# Patient Record
Sex: Male | Born: 1975 | Hispanic: Yes | Marital: Married | Smoking: Never smoker
Health system: Southern US, Community
[De-identification: ages and names within clinical notes are randomized; demographics above are authoritative.]

## PROBLEM LIST (undated history)

## (undated) DIAGNOSIS — G5621 Lesion of ulnar nerve, right upper limb: Secondary | ICD-10-CM

## (undated) HISTORY — PX: WRIST SURGERY: SHX841

---

## 2013-09-01 DIAGNOSIS — S52209A Unspecified fracture of shaft of unspecified ulna, initial encounter for closed fracture: Secondary | ICD-10-CM

## 2013-09-01 DIAGNOSIS — S63599A Other specified sprain of unspecified wrist, initial encounter: Secondary | ICD-10-CM

## 2013-09-01 HISTORY — DX: Unspecified fracture of shaft of unspecified ulna, initial encounter for closed fracture: S52.209A

## 2013-09-01 HISTORY — DX: Other specified sprain of unspecified wrist, initial encounter: S63.599A

## 2014-03-05 DIAGNOSIS — S63599A Other specified sprain of unspecified wrist, initial encounter: Secondary | ICD-10-CM | POA: Insufficient documentation

## 2015-05-01 DIAGNOSIS — M25839 Other specified joint disorders, unspecified wrist: Secondary | ICD-10-CM | POA: Insufficient documentation

## 2015-05-01 HISTORY — PX: WRIST ARTHROTOMY: SUR112

## 2015-05-01 HISTORY — PX: WRIST ARTHROSCOPY: SUR100

## 2018-04-06 ENCOUNTER — Other Ambulatory Visit: Payer: Self-pay

## 2018-04-06 ENCOUNTER — Emergency Department (HOSPITAL_BASED_OUTPATIENT_CLINIC_OR_DEPARTMENT_OTHER): Payer: Managed Care, Other (non HMO)

## 2018-04-06 ENCOUNTER — Emergency Department (HOSPITAL_BASED_OUTPATIENT_CLINIC_OR_DEPARTMENT_OTHER)
Admission: EM | Admit: 2018-04-06 | Discharge: 2018-04-07 | Disposition: A | Discharge status: Home / Self Care | Payer: Managed Care, Other (non HMO) | Attending: Emergency Medicine | Admitting: Emergency Medicine

## 2018-04-06 ENCOUNTER — Encounter (HOSPITAL_BASED_OUTPATIENT_CLINIC_OR_DEPARTMENT_OTHER): Payer: Self-pay | Admitting: Emergency Medicine

## 2018-04-06 DIAGNOSIS — M545 Low back pain: Secondary | ICD-10-CM | POA: Insufficient documentation

## 2018-04-06 MED ORDER — HYDROCODONE-ACETAMINOPHEN 5-325 MG PO TABS
2.0000 | ORAL_TABLET | Freq: Once | ORAL | Status: AC
  Administered 2018-04-06: 2 via ORAL
  Filled 2018-04-06: qty 2

## 2018-04-06 MED ORDER — ONDANSETRON 4 MG PO TBDP
4.0000 mg | ORAL_TABLET | Freq: Once | ORAL | Status: AC
  Administered 2018-04-06: 4 mg via ORAL
  Filled 2018-04-06: qty 1
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
## 2018-04-07 MED ORDER — METHOCARBAMOL 500 MG PO TABS: 500.0000 mg | ORAL_TABLET | Freq: Three times a day (TID) | ORAL | 0 refills | Status: AC | PRN

## 2018-04-07 MED ORDER — NAPROXEN 500 MG PO TABS: 500.0000 mg | ORAL_TABLET | Freq: Two times a day (BID) | ORAL | 0 refills | Status: AC

## 2018-04-07 NOTE — ED Notes (Signed)
PT states understanding of care given, follow up care, and medication prescribed. PT ambulated from ED to car with a steady gait. 

## 2020-06-23 ENCOUNTER — Telehealth: Payer: Self-pay

## 2020-06-23 NOTE — Telephone Encounter (Signed)
Patient would like to establish care with you 

## 2020-06-24 NOTE — Telephone Encounter (Signed)
It is okay, thank you.

## 2020-06-25 ENCOUNTER — Emergency Department (HOSPITAL_BASED_OUTPATIENT_CLINIC_OR_DEPARTMENT_OTHER): Payer: 59

## 2020-06-25 ENCOUNTER — Encounter (HOSPITAL_BASED_OUTPATIENT_CLINIC_OR_DEPARTMENT_OTHER): Payer: Self-pay

## 2020-06-25 ENCOUNTER — Emergency Department (HOSPITAL_BASED_OUTPATIENT_CLINIC_OR_DEPARTMENT_OTHER)
Admission: EM | Admit: 2020-06-25 | Discharge: 2020-06-25 | Disposition: A | Discharge status: Home / Self Care | Payer: 59 | Attending: Emergency Medicine | Admitting: Emergency Medicine

## 2020-06-25 ENCOUNTER — Other Ambulatory Visit: Payer: Self-pay

## 2020-06-25 DIAGNOSIS — R0789 Other chest pain: Secondary | ICD-10-CM | POA: Insufficient documentation

## 2020-06-25 DIAGNOSIS — R1011 Right upper quadrant pain: Secondary | ICD-10-CM | POA: Insufficient documentation

## 2020-06-25 DIAGNOSIS — S52611A Displaced fracture of right ulna styloid process, initial encounter for closed fracture: Secondary | ICD-10-CM | POA: Insufficient documentation

## 2020-06-25 DIAGNOSIS — R42 Dizziness and giddiness: Secondary | ICD-10-CM | POA: Diagnosis not present

## 2020-06-25 DIAGNOSIS — R1013 Epigastric pain: Secondary | ICD-10-CM | POA: Insufficient documentation

## 2020-06-25 DIAGNOSIS — G5621 Lesion of ulnar nerve, right upper limb: Secondary | ICD-10-CM | POA: Insufficient documentation

## 2020-06-25 HISTORY — DX: Lesion of ulnar nerve, right upper limb: G56.21

## 2020-06-25 LAB — CBC WITH DIFFERENTIAL/PLATELET
Abs Immature Granulocytes: 0.03 10*3/uL (ref 0.00–0.07)
Basophils Absolute: 0 10*3/uL (ref 0.0–0.1)
Basophils Relative: 0 %
Eosinophils Absolute: 0.1 10*3/uL (ref 0.0–0.5)
Eosinophils Relative: 2 %
HCT: 45.4 % (ref 39.0–52.0)
Hemoglobin: 15.3 g/dL (ref 13.0–17.0)
Immature Granulocytes: 0 %
Lymphocytes Relative: 32 %
Lymphs Abs: 2.3 10*3/uL (ref 0.7–4.0)
MCH: 30.2 pg (ref 26.0–34.0)
MCHC: 33.7 g/dL (ref 30.0–36.0)
MCV: 89.7 fL (ref 80.0–100.0)
Monocytes Absolute: 0.6 10*3/uL (ref 0.1–1.0)
Monocytes Relative: 8 %
Neutro Abs: 4.3 10*3/uL (ref 1.7–7.7)
Neutrophils Relative %: 58 %
Platelets: 274 10*3/uL (ref 150–400)
RBC: 5.06 MIL/uL (ref 4.22–5.81)
RDW: 12.9 % (ref 11.5–15.5)
WBC: 7.3 10*3/uL (ref 4.0–10.5)
nRBC: 0 % (ref 0.0–0.2)

## 2020-06-25 LAB — COMPREHENSIVE METABOLIC PANEL
ALT: 33 U/L (ref 0–44)
AST: 20 U/L (ref 15–41)
Albumin: 4.2 g/dL (ref 3.5–5.0)
Alkaline Phosphatase: 72 U/L (ref 38–126)
Anion gap: 8 (ref 5–15)
BUN: 17 mg/dL (ref 6–20)
CO2: 29 mmol/L (ref 22–32)
Calcium: 9.2 mg/dL (ref 8.9–10.3)
Chloride: 101 mmol/L (ref 98–111)
Creatinine, Ser: 0.87 mg/dL (ref 0.61–1.24)
GFR, Estimated: >60 mL/min (ref >60)
Glucose, Bld: 101 mg/dL — ABNORMAL HIGH (ref 70–99)
Potassium: 4.2 mmol/L (ref 3.5–5.1)
Sodium: 138 mmol/L (ref 135–145)
Total Bilirubin: 1.5 mg/dL — ABNORMAL HIGH (ref 0.3–1.2)
Total Protein: 7.5 g/dL (ref 6.5–8.1)

## 2020-06-25 LAB — OCCULT BLOOD X 1 CARD TO LAB, STOOL: Fecal Occult Bld: NEGATIVE

## 2020-06-25 LAB — LIPASE, BLOOD: Lipase: 26 U/L (ref 11–51)

## 2020-06-25 MED ORDER — OMEPRAZOLE 20 MG PO CPDR: 20.0000 mg | DELAYED_RELEASE_CAPSULE | Freq: Every day | ORAL | 0 refills | Status: AC

## 2020-06-25 NOTE — ED Notes (Signed)
Patient transported to US 

## 2020-06-25 NOTE — ED Provider Notes (Signed)
[facility] EMERGENCY DEPARTMENT Provider Note   CSN: [id number] Arrival date & time: 06/25/20  9024     History Chief Complaint  Patient presents with  . Chest Pain    Dale King is a 45 y.o. male.  HPI Patient presents with epigastric pain.  Goes to the back.  Some lightheadedness.  Is felt sweaty.  Feels a little dizzy when he stands up.  Also pain with pressure on the anterior chest.  Not on blood thinners.  States for the last few days also has had black tarry stool.  No history of GI bleed.  Not reportedly on NSAIDs.  No vomiting.    History reviewed. No pertinent past medical history.  There are no problems to display for this patient.   Past Surgical History:  Procedure Laterality Date  . WRIST SURGERY         No family history on file.  Social History   Tobacco Use  . Smoking status: Never Smoker  . Smokeless tobacco: Never Used  Substance Use Topics  . Alcohol use: Never  . Drug use: Never    Home Medications Prior to Admission medications   Medication Sig Start Date End Date Taking? Authorizing Provider  omeprazole (PRILOSEC) 20 MG capsule Take 1 capsule (20 mg total) by mouth daily. 06/25/20  Yes Benjiman Core, MD  methocarbamol (ROBAXIN) 500 MG tablet Take 1 tablet (500 mg total) by mouth every 8 (eight) hours as needed for muscle spasms. 04/07/18   Ward, Layla Maw, DO  naproxen (NAPROSYN) 500 MG tablet Take 1 tablet (500 mg total) by mouth 2 (two) times daily. 04/07/18   Ward, Layla Maw, DO    Allergies    Patient has no known allergies.  Review of Systems   Review of Systems  Constitutional: Negative for appetite change and diaphoresis.  HENT: Negative for congestion.   Respiratory: Negative for shortness of breath.   Cardiovascular: Positive for chest pain.  Gastrointestinal: Positive for abdominal pain.  Genitourinary: Negative for flank pain.  Musculoskeletal: Positive for back pain.  Skin: Negative for rash.   Neurological: Positive for light-headedness.  Psychiatric/Behavioral: Negative for confusion.    Physical Exam Updated Vital Signs BP 115/74 (BP Location: Right Arm)   Pulse 64   Temp 98.7 F (37.1 C) (Oral)   Resp 16   Ht 5\' 6"  (1.676 m)   Wt 92.1 kg   SpO2 100%   BMI 32.77 kg/m   Physical Exam Vitals reviewed.  HENT:     Head: Normocephalic.  Cardiovascular:     Rate and Rhythm: Normal rate and regular rhythm.  Pulmonary:     Breath sounds: No wheezing or rhonchi.  Chest:     Chest wall: Tenderness present.     Comments: Tenderness shows lower anterior mid chest wall. Abdominal:     Tenderness: There is abdominal tenderness.     Comments: Epigastric tenderness without rebound or guarding.  No hernia palpated  Musculoskeletal:     Cervical back: Neck supple.     Right lower leg: No edema.     Left lower leg: No edema.  Skin:    General: Skin is warm.     Capillary Refill: Capillary refill takes less than 2 seconds.  Neurological:     Mental Status: He is alert and oriented to person, place, and time.     ED Results / Procedures / Treatments   Labs (all labs ordered are listed, but only abnormal results are  displayed) Labs Reviewed  COMPREHENSIVE METABOLIC PANEL - Abnormal; Notable for the following components:      Result Value   Glucose, Bld 101 (*)    Total Bilirubin 1.5 (*)    All other components within normal limits  CBC WITH DIFFERENTIAL/PLATELET  LIPASE, BLOOD  OCCULT BLOOD X 1 CARD TO LAB, STOOL    EKG None  Radiology DG Chest Portable 1 View  Result Date: 06/25/2020 CLINICAL DATA:  Chest pain. EXAM: PORTABLE CHEST 1 VIEW COMPARISON:  None. FINDINGS: The heart size and mediastinal contours are within normal limits. There is no evidence of pulmonary edema, consolidation, pneumothorax, nodule or pleural fluid. The visualized skeletal structures are unremarkable. IMPRESSION: No active disease. Electronically Signed   By: Irish Lack M.D.    On: 06/25/2020 10:09   US Abdomen Limited RUQ (LIVER/GB)  Result Date: 06/25/2020 CLINICAL DATA:  Right upper quadrant pain EXAM: ULTRASOUND ABDOMEN LIMITED RIGHT UPPER QUADRANT COMPARISON:  None. FINDINGS: Gallbladder: No gallstones or wall thickening visualized. No sonographic Murphy sign noted by sonographer. Common bile duct: Very limited visualization.  No dilated ducts seen. Liver: Echogenic liver with sparing at the gallbladder fossa. There is also diminished acoustic penetration diffusely. No evidence of hepatic mass. Limited parenchymal visualization due to sonographic windows. Portal vein is patent on color Doppler imaging with normal direction of blood flow towards the liver. IMPRESSION: 1. Hepatic steatosis. 2. Negative gallbladder. Electronically Signed   By: Marnee Spring M.D.   On: 06/25/2020 11:51    Procedures Procedures   Medications Ordered in ED Medications - No data to display  ED Course  I have reviewed the triage vital signs and the nursing notes.  Pertinent labs & imaging results that were available during my care of the patient were reviewed by me and considered in my medical decision making (see chart for details).    MDM Rules/Calculators/A&P                          Patient epigastric pain.  Going to chest.  Had black stool but guaiac negative.  Lab work reassuring.  Ultrasound done and also reassuring.  With reports of melena however is worried some upper GI pathology.  I think patient be stable for outpatient follow-up with GI.  Will start Prilosec.  Do not feel as if we need CT scan at this time.  Well-appearing overall.  Discharge home.   Final Clinical Impression(s) / ED Diagnoses Final diagnoses:  RUQ pain  Epigastric pain    Rx / DC Orders ED Discharge Orders         Ordered    omeprazole (PRILOSEC) 20 MG capsule  Daily        06/25/20 1233           Benjiman Core, MD 06/25/20 1238

## 2020-06-25 NOTE — Discharge Instructions (Addendum)
Your epigastric pain with black stool is worried some for a stomach problem.  There was no blood in stool when checked in the lab was reassuring.  Ultrasound was reassuring.  Take the new medicine.  Follow-up with either our gastroenterologists or gastroenterologist or your primary care doctor.

## 2020-06-25 NOTE — Telephone Encounter (Signed)
Appt schedule with pt 

## 2020-06-25 NOTE — ED Notes (Signed)
ED Provider at bedside. 

## 2020-06-25 NOTE — ED Notes (Signed)
Occult blood card at bedside, EDP notified

## 2020-06-25 NOTE — ED Triage Notes (Signed)
Pt presents with 1 day history of mid epigastric chest pain that radiates to left side and around to back.  Rates pain 7/10.  Denies n/v.  Reports some lightheadedness and diaphoresis, and SOB.  He has not taken any medications for this issue.

## 2020-07-16 ENCOUNTER — Ambulatory Visit: Payer: 59 | Admitting: Internal Medicine

## 2022-12-19 IMAGING — US US ABDOMEN LIMITED
1 series · 14 of 25 positions shown · non-contrast
Comparison: None.

CLINICAL DATA: Right upper quadrant pain

EXAM:
ULTRASOUND ABDOMEN LIMITED RIGHT UPPER QUADRANT

[Series 1: us abdomen limited · 14 of 32 slices shown]
[im 1/32]
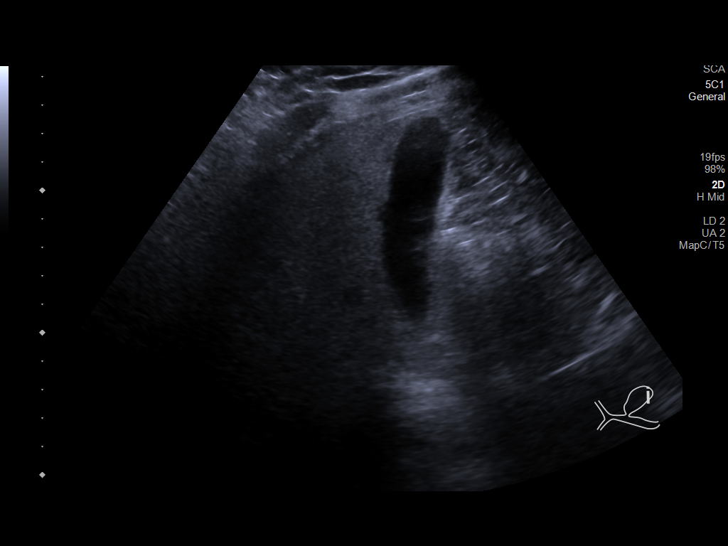
[im 3/32]
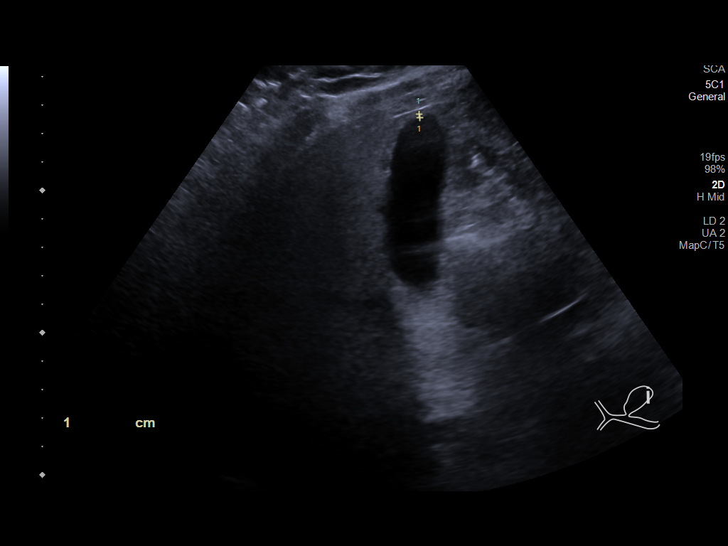
[im 6/32]
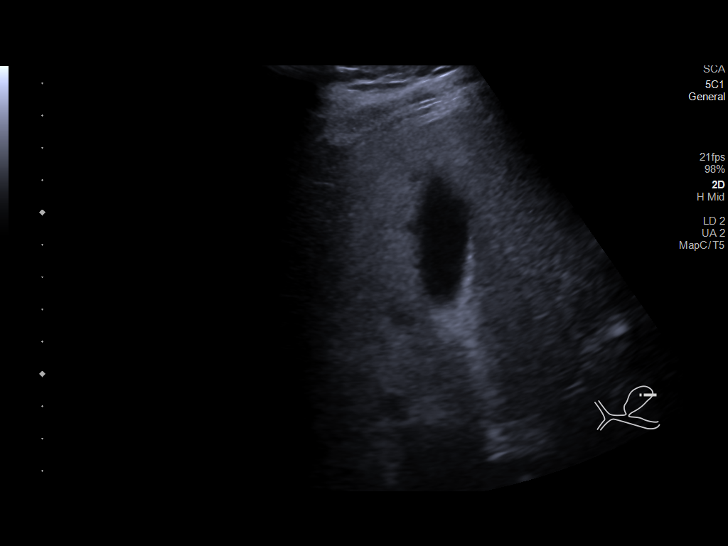
[im 8/32]
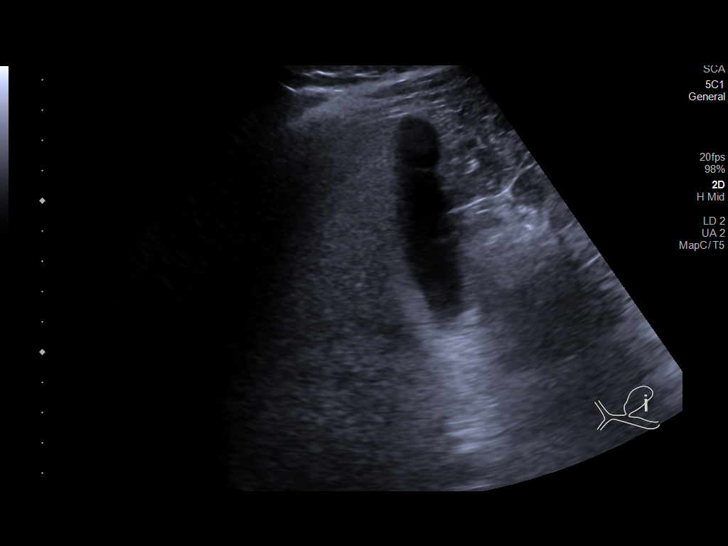
[im 11/32]
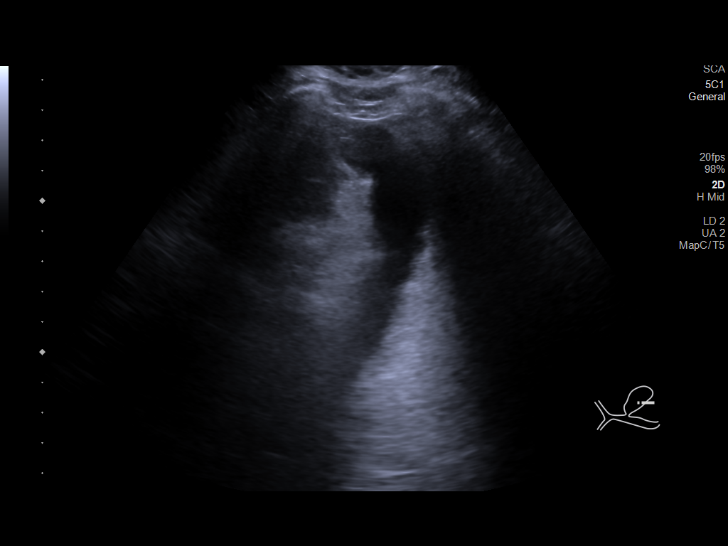
[im 12/32]
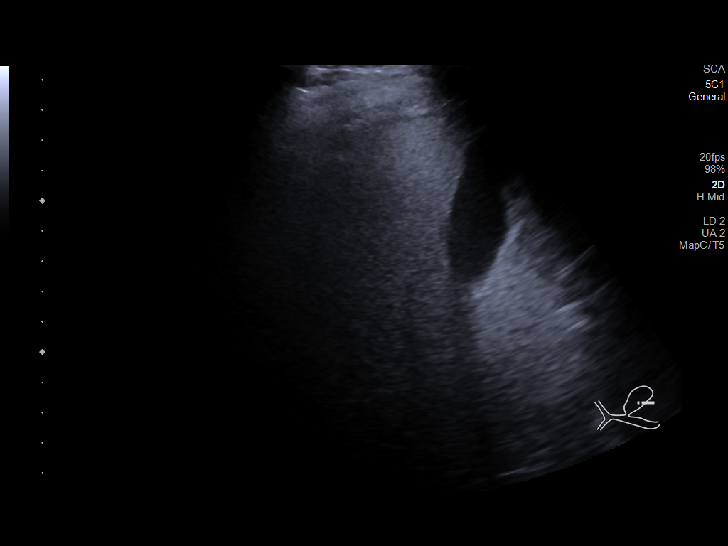
[im 15/32]
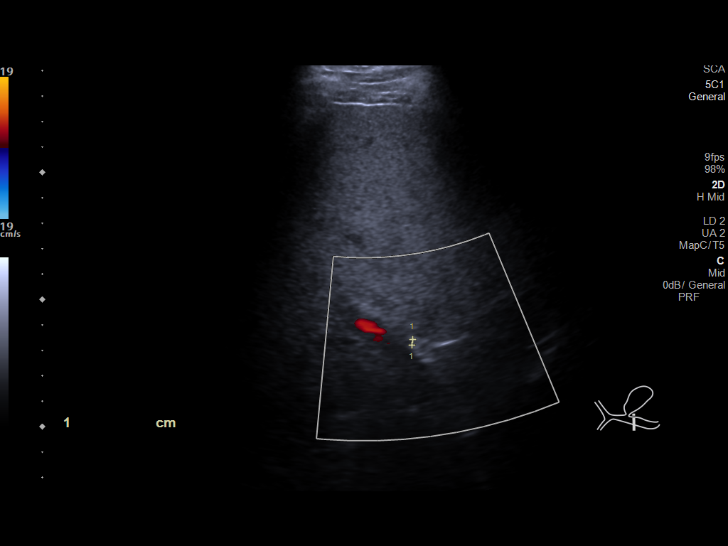
[im 17/32]
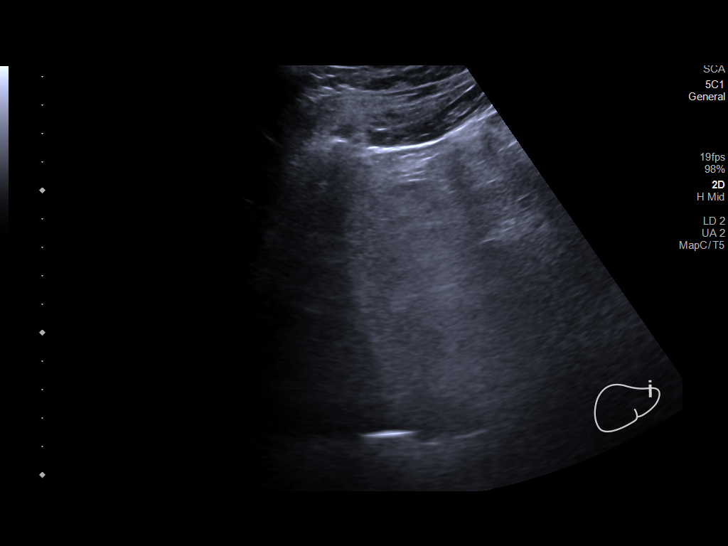
[im 20/32]
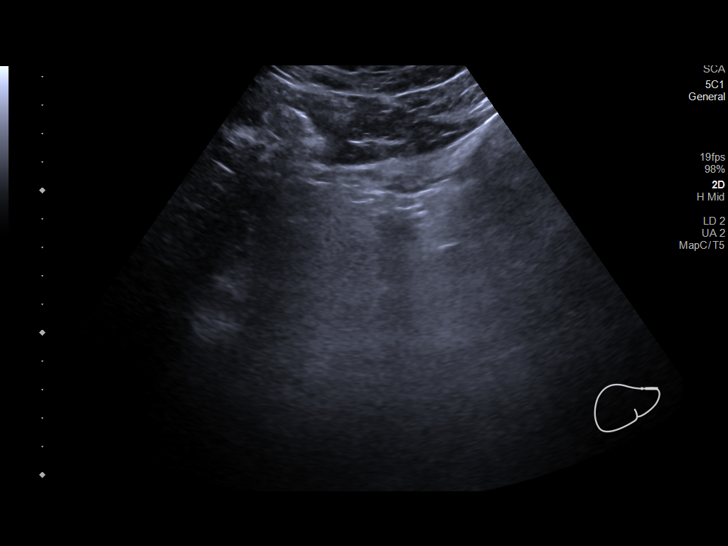
[im 21/32]
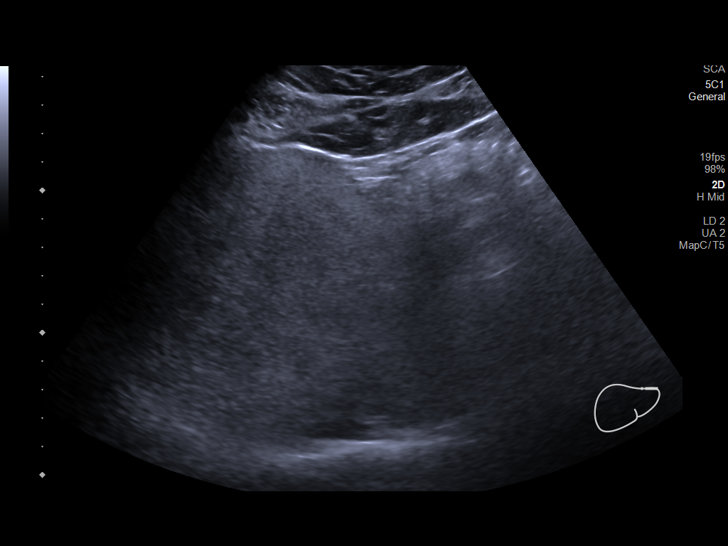
[im 24/32]
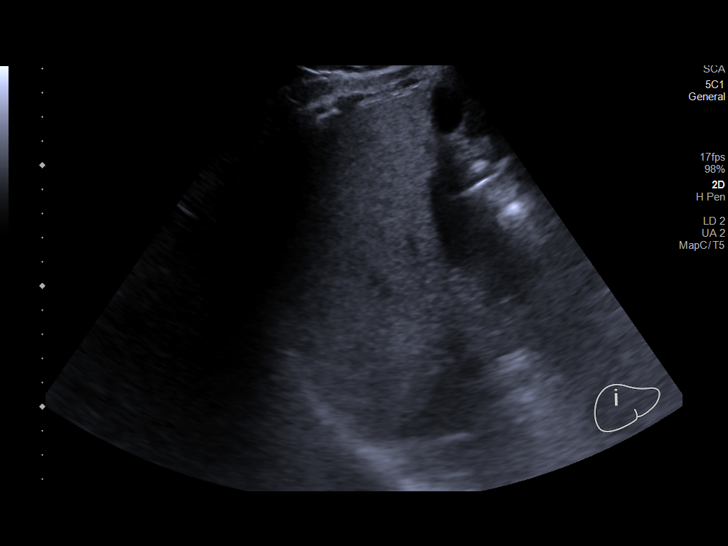
[im 26/32]
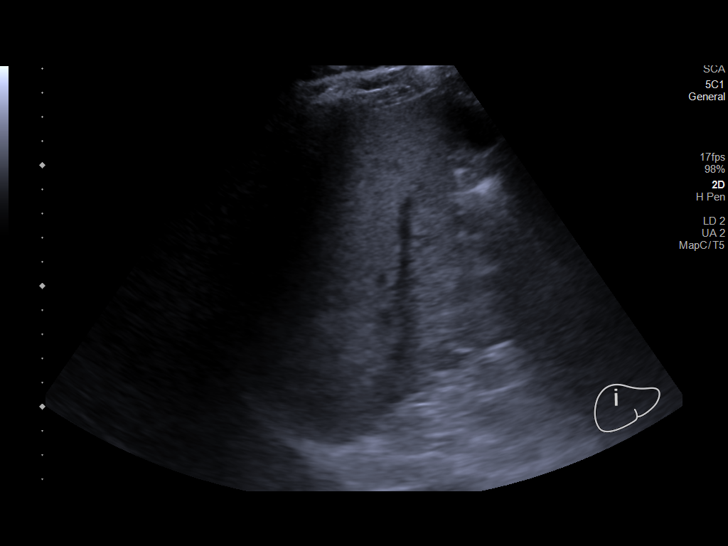
[im 29/32]
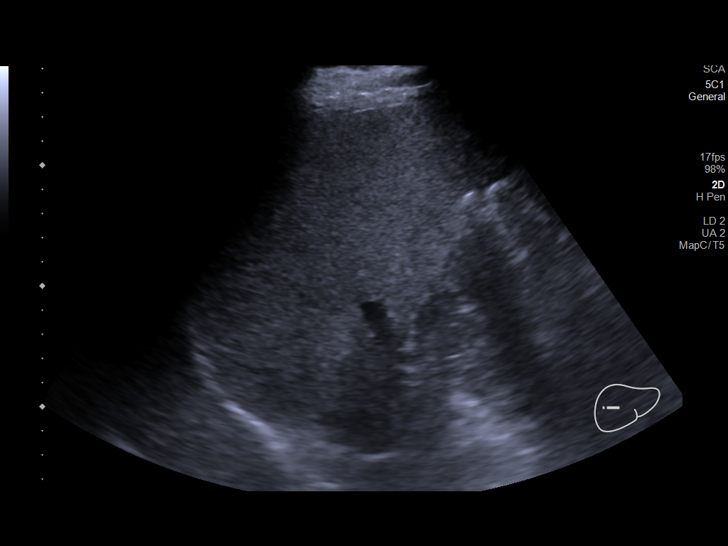
[im 32/32]
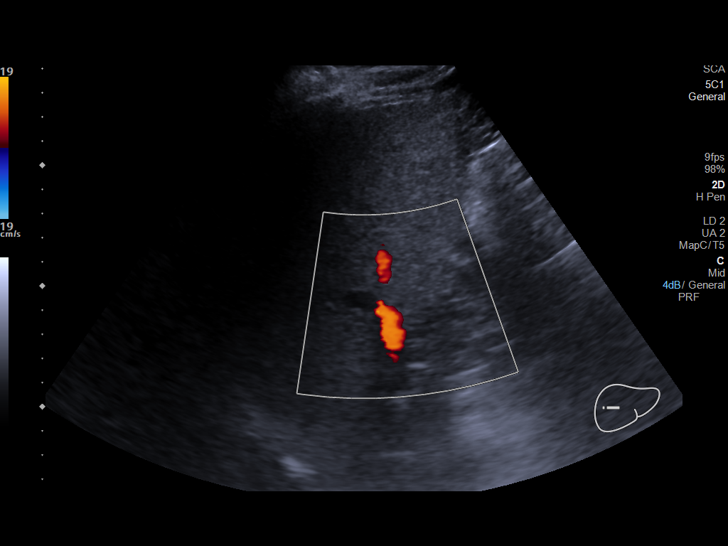

[14 of 25 positions shown; findings below may reference images not displayed]

FINDINGS: Gallbladder:

No gallstones or wall thickening visualized. No sonographic Murphy
sign noted by sonographer.

Common bile duct:

Very limited visualization.  No dilated ducts seen.

Liver:

Echogenic liver with sparing at the gallbladder fossa. There is also
diminished acoustic penetration diffusely. No evidence of hepatic
mass. Limited parenchymal visualization due to sonographic windows.
Portal vein is patent on color Doppler imaging with normal direction
of blood flow towards the liver.
IMPRESSION: 1. Hepatic steatosis.
2. Negative gallbladder.
